# Patient Record
Sex: Female | Born: 1995 | Race: White | Hispanic: No | Marital: Single | Smoking: Never smoker
Health system: Southern US, Community
[De-identification: ages and names within clinical notes are randomized; demographics above are authoritative.]

## PROBLEM LIST (undated history)

## (undated) DIAGNOSIS — T7840XA Allergy, unspecified, initial encounter: Secondary | ICD-10-CM

## (undated) DIAGNOSIS — J020 Streptococcal pharyngitis: Secondary | ICD-10-CM

## (undated) HISTORY — DX: Streptococcal pharyngitis: J02.0

## (undated) HISTORY — PX: OTHER SURGICAL HISTORY: SHX169

## (undated) HISTORY — DX: Allergy, unspecified, initial encounter: T78.40XA

---
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
## 2016-01-13 ENCOUNTER — Encounter: Payer: Self-pay | Admitting: Family Medicine

## 2016-01-13 ENCOUNTER — Ambulatory Visit (INDEPENDENT_AMBULATORY_CARE_PROVIDER_SITE_OTHER): Payer: Self-pay | Admitting: Family Medicine

## 2016-01-13 DIAGNOSIS — L03012 Cellulitis of left finger: Secondary | ICD-10-CM

## 2016-01-13 MED ORDER — SULFAMETHOXAZOLE-TRIMETHOPRIM 800-160 MG PO TABS: 1.0000 | ORAL_TABLET | Freq: Two times a day (BID) | ORAL | 0 refills | Status: DC

## 2016-01-13 NOTE — Progress Notes (Signed)
Pre visit review using our clinic review tool, if applicable. No additional management support is needed unless otherwise documented below in the visit note. 

## 2016-01-13 NOTE — Assessment & Plan Note (Signed)
New but non fluctuant as it has been present for weeks, now chronic. I and D not appropriate at this time. Treat with oral abx- Bactrim DS. Continue soaks. Call or return to clinic prn if these symptoms worsen or fail to improve as anticipated. The patient indicates understanding of these issues and agrees with the plan.

## 2016-01-13 NOTE — Patient Instructions (Signed)
Great to see you. Keep soaking your finger.  Take Bactrim DS - 1 tablet twice daily x 7 days.  Keep me updated.

## 2016-01-13 NOTE — Progress Notes (Signed)
   Subjective:   Patient ID: Valerie Padilla, female    DOB: 01/25/1996, 20 y.o.   MRN: [id number]  Valerie Padilla is a pleasant 20 y.o. year old female who presents to clinic today with Hand Pain (R index)  on 01/13/2016  HPI:  Developed pain and swelling of lateral nail bed of 1st digit of left hand over two weeks ago.  Was pulsating and very warm and tender.  Since then, swelling has decreased some and no longer warm or tender. Still swollen.  Soaking it in salt water in the evenings.  No fevers or chills.  Never had anything like this before.  Current Outpatient Prescriptions on File Prior to Visit  Medication Sig Dispense Refill  . norgestrel-ethinyl estradiol (CRYSELLE-28) 0.3-30 MG-MCG tablet Take 1 tablet by mouth daily.     No current facility-administered medications on file prior to visit.     Allergies  Allergen Reactions  . Amoxicillin Nausea Only    Past Medical History:  Diagnosis Date  . Allergy   . Strep throat 11-2009 and 03-2010    Past Surgical History:  Procedure Laterality Date  . sinus surgery      No family history on file.  Social History   Social History  . Marital status: Single    Spouse name: N/A  . Number of children: N/A  . Years of education: N/A   Occupational History  . Not on file.   Social History Main Topics  . Smoking status: Never Smoker  . Smokeless tobacco: Never Used  . Alcohol use No  . Drug use: No  . Sexual activity: Not on file   Other Topics Concern  . Not on file   Social History Narrative   Lives with mom,dad,brother and sister   7th grader at [employer]   Loves Cheerleading   Wants to be a pediatrician   The PMH, PSH, Social History, Family History, Medications, and allergies have been reviewed in [facility], and have been updated if relevant.   Review of Systems  Constitutional: Negative.   Gastrointestinal: Negative.   Neurological: Negative.   All other systems reviewed and are  negative.      Objective:    BP (!) 98/48   Pulse 67   Temp 98 F (36.7 C) (Oral)   Wt 136 lb 8 oz (61.9 kg)   LMP 12/31/2015   SpO2 98%   BMI 22.20 kg/m    Physical Exam  Constitutional: She is oriented to person, place, and time. She appears well-developed and well-nourished. No distress.  HENT:  Head: Normocephalic.  Eyes: Conjunctivae are normal.  Cardiovascular: Normal rate.   Pulmonary/Chest: Effort normal.  Musculoskeletal:       Arms: Neurological: She is alert and oriented to person, place, and time. No cranial nerve deficit.  Skin: She is not diaphoretic.  Psychiatric: She has a normal mood and affect. Her behavior is normal. Judgment and thought content normal.  Nursing note and vitals reviewed.         Assessment & Plan:   Paronychia of finger of left hand No Follow-up on file.

## 2016-01-14 ENCOUNTER — Telehealth: Payer: Self-pay

## 2016-01-14 MED ORDER — DOXYCYCLINE HYCLATE 100 MG PO TABS: 100.0000 mg | ORAL_TABLET | Freq: Two times a day (BID) | ORAL | 0 refills | Status: AC

## 2016-01-14 NOTE — Telephone Encounter (Signed)
pts mom (do not see DPR) left v/m that pt was seen 01/13/16 and started abx; pt vomited all night and request not as strong abx to [facility].pts mom request cb.

## 2016-01-14 NOTE — Telephone Encounter (Signed)
I'm sorry to hear that.   Bactrim added to her allergy list as an intolerance. We are limited given her allergy to amoxicillin.  Will try doxycyline.  eRX sent.

## 2016-01-28 ENCOUNTER — Telehealth: Payer: Self-pay

## 2016-01-28 NOTE — Telephone Encounter (Signed)
Attempted to contact pt; unable to leave vm 

## 2016-01-28 NOTE — Telephone Encounter (Signed)
She was given a long course of abx.  I wouldn't recommend another round at this time.  I would advise that she been seen by a clinic at school if symptoms worsen.

## 2016-01-28 NOTE — Telephone Encounter (Signed)
pts mom (do not see DPR) left v/m; pt was seen 01/13/16 and has taken all of abx; finger looks much better but still has some redness;does not appear any pus in finger . Pt is at college now and cannot come in for appt.Pt wants to know if could get refill of abx to make sure it clears completely. pts [facility]

## 2016-01-29 NOTE — Telephone Encounter (Signed)
Valerie Padilla left v/m requesting cb.

## 2016-01-29 NOTE — Telephone Encounter (Signed)
Pt returned call - please call back at [phone] Thanks

## 2016-01-29 NOTE — Telephone Encounter (Signed)
Lm on pts vm requesting a call back. Advised pt I am not able to speak with her mom and she will have to contact me back directly.

## 2016-01-29 NOTE — Telephone Encounter (Signed)
Spoke to pt and advised per Dr Dayton MartesAron. Pt not wanting to schedule appt at current time.

## 2017-12-20 ENCOUNTER — Telehealth: Payer: Self-pay | Admitting: Family Medicine

## 2017-12-20 DIAGNOSIS — Z111 Encounter for screening for respiratory tuberculosis: Secondary | ICD-10-CM

## 2017-12-20 NOTE — Telephone Encounter (Signed)
Copied from CRM [phone]. Topic: Quick Communication - See Telephone Encounter >> Dec 20, 2017 10:15 AM Terisa Starraylor, Brittany L wrote: CRM for notification. See Telephone encounter for: 12/20/17.  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 Please call her back @ [phone]. Marsh Dolly(Marianna), she wants this done today if possible. Patient's mom is aware that Dr Dayton MartesAron no longer works at [facility] and does not want to go to [employer] [employer]. I advised her as well that she needs to set up an Transfer of Care appointment.

## 2017-12-20 NOTE — Telephone Encounter (Signed)
Pt is requesting orders for TB gold test that is needed before 12/24/17 for nursing school. She would like the orders and test to [facility]. Please call pt on cell #[phone].

## 2017-12-20 NOTE — Telephone Encounter (Signed)
To my knowledge Juanetta GoslingQuantiferon Gold is a lab test not an injection.

## 2017-12-20 NOTE — Telephone Encounter (Signed)
Left VM for pt to return call to office to let me know which [facility] she would like to go to today to get Q-feron test completed.

## 2017-12-20 NOTE — Telephone Encounter (Signed)
Okay to order as requested- not sure if where she would like this done- [facility]?
